# Patient Record
Sex: Male | Born: 1960 | Race: White | Hispanic: No | Marital: Single | Smoking: Current every day smoker
Health system: Southern US, Community
[De-identification: ages and names within clinical notes are randomized; demographics above are authoritative.]

## PROBLEM LIST (undated history)

## (undated) DIAGNOSIS — E119 Type 2 diabetes mellitus without complications: Secondary | ICD-10-CM

## (undated) DIAGNOSIS — IMO0001 Reserved for inherently not codable concepts without codable children: Secondary | ICD-10-CM

## (undated) DIAGNOSIS — I10 Essential (primary) hypertension: Secondary | ICD-10-CM

## (undated) HISTORY — PX: OTHER SURGICAL HISTORY: SHX169

## (undated) HISTORY — PX: FOOT SURGERY: SHX648

---

## 2000-03-18 ENCOUNTER — Encounter: Admission: RE | Admit: 2000-03-18 | Discharge: 2000-06-16 | Payer: Self-pay | Admitting: Family Medicine

## 2000-08-02 ENCOUNTER — Encounter: Admission: RE | Admit: 2000-08-02 | Discharge: 2000-10-31 | Payer: Self-pay | Admitting: Family Medicine

## 2006-05-24 ENCOUNTER — Emergency Department (HOSPITAL_COMMUNITY): Admission: EM | Admit: 2006-05-24 | Discharge: 2006-05-25 | Payer: Self-pay | Admitting: Emergency Medicine

## 2008-06-10 ENCOUNTER — Emergency Department (HOSPITAL_COMMUNITY): Admission: EM | Admit: 2008-06-10 | Discharge: 2008-06-10 | Payer: Self-pay | Admitting: Emergency Medicine

## 2014-10-02 ENCOUNTER — Observation Stay (HOSPITAL_BASED_OUTPATIENT_CLINIC_OR_DEPARTMENT_OTHER)
Admission: EM | Admit: 2014-10-02 | Discharge: 2014-10-03 | Disposition: A | Discharge status: Home / Self Care | Payer: 59 | Attending: Internal Medicine | Admitting: Internal Medicine

## 2014-10-02 ENCOUNTER — Emergency Department (HOSPITAL_BASED_OUTPATIENT_CLINIC_OR_DEPARTMENT_OTHER): Payer: 59

## 2014-10-02 ENCOUNTER — Encounter (HOSPITAL_BASED_OUTPATIENT_CLINIC_OR_DEPARTMENT_OTHER): Payer: Self-pay

## 2014-10-02 DIAGNOSIS — E876 Hypokalemia: Secondary | ICD-10-CM | POA: Insufficient documentation

## 2014-10-02 DIAGNOSIS — E119 Type 2 diabetes mellitus without complications: Secondary | ICD-10-CM

## 2014-10-02 DIAGNOSIS — R079 Chest pain, unspecified: Secondary | ICD-10-CM | POA: Diagnosis present

## 2014-10-02 DIAGNOSIS — Z7982 Long term (current) use of aspirin: Secondary | ICD-10-CM | POA: Insufficient documentation

## 2014-10-02 DIAGNOSIS — R0602 Shortness of breath: Secondary | ICD-10-CM | POA: Diagnosis not present

## 2014-10-02 DIAGNOSIS — I10 Essential (primary) hypertension: Secondary | ICD-10-CM | POA: Insufficient documentation

## 2014-10-02 DIAGNOSIS — R0789 Other chest pain: Secondary | ICD-10-CM | POA: Insufficient documentation

## 2014-10-02 DIAGNOSIS — Z720 Tobacco use: Secondary | ICD-10-CM | POA: Diagnosis present

## 2014-10-02 DIAGNOSIS — E785 Hyperlipidemia, unspecified: Secondary | ICD-10-CM | POA: Diagnosis not present

## 2014-10-02 HISTORY — DX: Essential (primary) hypertension: I10

## 2014-10-02 HISTORY — DX: Reserved for inherently not codable concepts without codable children: IMO0001

## 2014-10-02 HISTORY — DX: Type 2 diabetes mellitus without complications: E11.9

## 2014-10-02 LAB — CBC WITH DIFFERENTIAL/PLATELET
Basophils Absolute: 0.1 10*3/uL (ref 0.0–0.1)
Basophils Relative: 1 % (ref 0–1)
Eosinophils Absolute: 0.1 10*3/uL (ref 0.0–0.7)
Eosinophils Relative: 1 % (ref 0–5)
HCT: 49.1 % (ref 39.0–52.0)
Hemoglobin: 17.7 g/dL — ABNORMAL HIGH (ref 13.0–17.0)
Lymphocytes Relative: 15 % (ref 12–46)
Lymphs Abs: 1.2 10*3/uL (ref 0.7–4.0)
MCH: 33.2 pg (ref 26.0–34.0)
MCHC: 36 g/dL (ref 30.0–36.0)
MCV: 92.1 fL (ref 78.0–100.0)
Monocytes Absolute: 0.8 10*3/uL (ref 0.1–1.0)
Monocytes Relative: 10 % (ref 3–12)
NEUTROS ABS: 5.8 10*3/uL (ref 1.7–7.7)
NEUTROS PCT: 73 % (ref 43–77)
Platelets: 170 10*3/uL (ref 150–400)
RBC: 5.33 MIL/uL (ref 4.22–5.81)
RDW: 12.6 % (ref 11.5–15.5)
WBC: 7.9 10*3/uL (ref 4.0–10.5)

## 2014-10-02 LAB — BASIC METABOLIC PANEL
Anion gap: 8 (ref 5–15)
BUN: 15 mg/dL (ref 6–23)
CALCIUM: 9.1 mg/dL (ref 8.4–10.5)
CO2: 27 mmol/L (ref 19–32)
Chloride: 106 mmol/L (ref 96–112)
Creatinine, Ser: 1 mg/dL (ref 0.50–1.35)
GFR calc Af Amer: >90 mL/min (ref >90)
GFR calc non Af Amer: 84 mL/min — ABNORMAL LOW (ref >90)
GLUCOSE: 167 mg/dL — AB (ref 70–99)
Potassium: 3.5 mmol/L (ref 3.5–5.1)
SODIUM: 141 mmol/L (ref 135–145)

## 2014-10-02 LAB — TROPONIN I: Troponin I: <0.03 ng/mL (ref <0.031)

## 2014-10-02 MED ORDER — NITROGLYCERIN 0.4 MG SL SUBL
0.4000 mg | SUBLINGUAL_TABLET | SUBLINGUAL | Status: DC | PRN
  Administered 2014-10-02: 0.4 mg via SUBLINGUAL
  Filled 2014-10-02: qty 1

## 2014-10-02 MED ORDER — ASPIRIN 81 MG PO CHEW
324.0000 mg | CHEWABLE_TABLET | Freq: Once | ORAL | Status: AC
  Administered 2014-10-02: 324 mg via ORAL
  Filled 2014-10-02: qty 4

## 2014-10-02 NOTE — ED Notes (Signed)
C/o SOB, sweats last week with exertion-chest "heaviness" x today

## 2014-10-02 NOTE — ED Provider Notes (Signed)
CSN: [id number]     Arrival date & time 10/02/14  1853 History  This chart was scribed for Rolan BuccoMelanie Perrin Eddleman, MD by Modena JanskyAlbert Thayil, ED Scribe. This patient was seen in room MH05/MH05 and the patient's care was started at 9:37 PM.    Chief Complaint  Patient presents with  . Shortness of Breath   The history is provided by the patient. No language interpreter was used.   HPI Comments: Bosie HelperRichard Gangemi is a 54 y.o. male who presents to the Emergency Department complaining of constant moderate chest pain that started this morning. He states that this morning he had a sudden onset of chest tightness and pressure with SOB. He reports that the SOB lasted about 5 minutes. He reports that he had another episode of SOB with diaphoresis a few days ago. He states no modifying factors. He reports a hx of DM, HTN, hypercholesterolemia, and smoking. He denies any usual leg swelling, leg pain, diaphoresis, nausea, or cold symptoms today.   Past Medical History  Diagnosis Date  . Diabetes mellitus without complication   . Hypertension    Past Surgical History  Procedure Laterality Date  . Foot surgery    . Ankle surgery     No family history on file. History  Substance Use Topics  . Smoking status: Current Every Day Smoker  . Smokeless tobacco: Not on file  . Alcohol Use: Yes    Review of Systems  Constitutional: Negative for fever, chills, diaphoresis and fatigue.  HENT: Negative for congestion, rhinorrhea and sneezing.   Eyes: Negative.   Respiratory: Positive for chest tightness and shortness of breath. Negative for cough.   Cardiovascular: Positive for chest pain. Negative for leg swelling.  Gastrointestinal: Negative for nausea, vomiting, abdominal pain, diarrhea and blood in stool.  Genitourinary: Negative for frequency, hematuria, flank pain and difficulty urinating.  Musculoskeletal: Negative for back pain and arthralgias.  Skin: Negative for rash.  Neurological: Negative for dizziness,  speech difficulty, weakness, numbness and headaches.   Allergies  Review of patient's allergies indicates no known allergies.  Home Medications   Prior to Admission medications   Medication Sig Start Date End Date Taking? Authorizing Provider  AMLODIPINE BESYLATE PO Take by mouth.   Yes Historical Provider, MD  CARVEDILOL PO Take by mouth.   Yes Historical Provider, MD  HYDRALAZINE-HCTZ PO Take by mouth.   Yes Historical Provider, MD  LISINOPRIL PO Take by mouth.   Yes Historical Provider, MD  METFORMIN HCL PO Take by mouth.   Yes Historical Provider, MD  SIMVASTATIN PO Take by mouth.   Yes Historical Provider, MD   BP 162/74 mmHg  Pulse 85  Temp(Src) 98.5 F (36.9 C) (Oral)  Resp 18  Ht 6\' 3"  (1.905 m)  Wt 267 lb (121.11 kg)  BMI 33.37 kg/m2  SpO2 99% Physical Exam  Constitutional: He is oriented to person, place, and time. He appears well-developed and well-nourished.  HENT:  Head: Normocephalic and atraumatic.  Eyes: Pupils are equal, round, and reactive to light.  Neck: Normal range of motion. Neck supple.  Cardiovascular: Normal rate, regular rhythm and normal heart sounds.   Pulmonary/Chest: Effort normal and breath sounds normal. No respiratory distress. He has no wheezes. He has no rales. He exhibits no tenderness.  Abdominal: Soft. Bowel sounds are normal. There is no tenderness. There is no rebound and no guarding.  Musculoskeletal: Normal range of motion.  Trace pitting edema bilaterally. No calf tenderness.   Lymphadenopathy:    He  has no cervical adenopathy.  Neurological: He is alert and oriented to person, place, and time.  Skin: Skin is warm and dry. No rash noted.  Psychiatric: He has a normal mood and affect.  Nursing note and vitals reviewed.   ED Course  Procedures (including critical care time) DIAGNOSTIC STUDIES: Oxygen Saturation is 99% on RA, normal by my interpretation.    COORDINATION OF CARE: 9:41 PM- Pt advised of plan for treatment which  includes radiology and labs and pt agrees.  Labs Review Labs Reviewed  CBC WITH DIFFERENTIAL/PLATELET - Abnormal; Notable for the following:    Hemoglobin 17.7 (*)    All other components within normal limits  BASIC METABOLIC PANEL - Abnormal; Notable for the following:    Glucose, Bld 167 (*)    GFR calc non Af Amer 84 (*)    All other components within normal limits  TROPONIN I    Imaging Review Dg Chest 2 View  10/02/2014   CLINICAL DATA:  Shortness of breath chest numbness and heaviness today  EXAM: CHEST  2 VIEW  COMPARISON:  None.  FINDINGS: The heart size and mediastinal contours are within normal limits. Both lungs are clear. The visualized skeletal structures are unremarkable.  IMPRESSION: No active cardiopulmonary disease.   Electronically Signed   By: Esperanza Heir M.D.   On: 10/02/2014 19:59     EKG Interpretation   Date/Time:  Tuesday October 02 2014 19:09:16 EDT Ventricular Rate:  81 PR Interval:  198 QRS Duration: 102 QT Interval:  372 QTC Calculation: 432 R Axis:   -56 Text Interpretation:  Normal sinus rhythm Incomplete right bundle branch  block Left anterior fascicular block Minimal voltage criteria for LVH, may  be normal variant Possible Lateral infarct , age undetermined Abnormal ECG  No old tracing to compare Confirmed by Carlen Fils  MD, Cheyla Duchemin ([id number]) on  10/02/2014 7:12:54 PM      MDM   Final diagnoses:  Chest pain, unspecified chest pain type    Patient presents with chest pain associated shortness of breath. It was exertional this weekend but nonexertional today. He has multiple cardiac risk factors. He was given aspirin and nitroglycerin. He is currently pain-free. His EKG does not show any acute ischemia changes. His troponin is negative. Given his underlying risk factors I consult with the hospitalist, Dr.Niu who is accepted the patient for transfer to [facility] for further cardiac evaluation. I personally performed the services described in this  documentation, which was scribed in my presence.  The recorded information has been reviewed and considered.     Rolan Bucco, MD 10/02/14 [phone]

## 2014-10-03 ENCOUNTER — Encounter (HOSPITAL_COMMUNITY): Payer: Self-pay | Admitting: *Deleted

## 2014-10-03 DIAGNOSIS — E119 Type 2 diabetes mellitus without complications: Secondary | ICD-10-CM | POA: Diagnosis not present

## 2014-10-03 DIAGNOSIS — R079 Chest pain, unspecified: Secondary | ICD-10-CM | POA: Diagnosis not present

## 2014-10-03 DIAGNOSIS — I10 Essential (primary) hypertension: Secondary | ICD-10-CM | POA: Diagnosis not present

## 2014-10-03 DIAGNOSIS — Z720 Tobacco use: Secondary | ICD-10-CM

## 2014-10-03 DIAGNOSIS — R0602 Shortness of breath: Secondary | ICD-10-CM

## 2014-10-03 LAB — CBC
HCT: 46.5 % (ref 39.0–52.0)
Hemoglobin: 16.6 g/dL (ref 13.0–17.0)
MCH: 33.4 pg (ref 26.0–34.0)
MCHC: 35.7 g/dL (ref 30.0–36.0)
MCV: 93.6 fL (ref 78.0–100.0)
Platelets: 161 10*3/uL (ref 150–400)
RBC: 4.97 MIL/uL (ref 4.22–5.81)
RDW: 12.8 % (ref 11.5–15.5)
WBC: 7.4 10*3/uL (ref 4.0–10.5)

## 2014-10-03 LAB — LIPID PANEL
CHOLESTEROL: 106 mg/dL (ref 0–200)
HDL: 29 mg/dL — ABNORMAL LOW (ref >39)
LDL Cholesterol: 52 mg/dL (ref 0–99)
Total CHOL/HDL Ratio: 3.7 RATIO
Triglycerides: 127 mg/dL (ref <150)
VLDL: 25 mg/dL (ref 0–40)

## 2014-10-03 LAB — GLUCOSE, CAPILLARY
GLUCOSE-CAPILLARY: 142 mg/dL — AB (ref 70–99)
Glucose-Capillary: 129 mg/dL — ABNORMAL HIGH (ref 70–99)
Glucose-Capillary: 139 mg/dL — ABNORMAL HIGH (ref 70–99)

## 2014-10-03 LAB — BASIC METABOLIC PANEL
Anion gap: 9 (ref 5–15)
BUN: 10 mg/dL (ref 6–23)
CHLORIDE: 104 mmol/L (ref 96–112)
CO2: 27 mmol/L (ref 19–32)
CREATININE: 0.89 mg/dL (ref 0.50–1.35)
Calcium: 8.9 mg/dL (ref 8.4–10.5)
GFR calc non Af Amer: >90 mL/min (ref >90)
Glucose, Bld: 111 mg/dL — ABNORMAL HIGH (ref 70–99)
POTASSIUM: 3.4 mmol/L — AB (ref 3.5–5.1)
Sodium: 140 mmol/L (ref 135–145)

## 2014-10-03 LAB — TROPONIN I
Troponin I: <0.03 ng/mL (ref <0.031)
Troponin I: <0.03 ng/mL (ref <0.031)
Troponin I: <0.03 ng/mL (ref <0.031)

## 2014-10-03 MED ORDER — HYDROCHLOROTHIAZIDE 12.5 MG PO CAPS
12.5000 mg | ORAL_CAPSULE | Freq: Every day | ORAL | Status: DC
  Administered 2014-10-03: 12.5 mg via ORAL
  Filled 2014-10-03: qty 1

## 2014-10-03 MED ORDER — MORPHINE SULFATE 2 MG/ML IJ SOLN: 2.0000 mg | INTRAMUSCULAR | Status: DC | PRN

## 2014-10-03 MED ORDER — INSULIN ASPART 100 UNIT/ML ~~LOC~~ SOLN
0.0000 [IU] | Freq: Three times a day (TID) | SUBCUTANEOUS | Status: DC
  Administered 2014-10-03: 1 [IU] via SUBCUTANEOUS

## 2014-10-03 MED ORDER — AMLODIPINE BESYLATE 5 MG PO TABS
5.0000 mg | ORAL_TABLET | Freq: Every day | ORAL | Status: DC
  Administered 2014-10-03: 5 mg via ORAL
  Filled 2014-10-03: qty 1

## 2014-10-03 MED ORDER — ASPIRIN EC 325 MG PO TBEC
325.0000 mg | DELAYED_RELEASE_TABLET | Freq: Every day | ORAL | Status: DC
  Administered 2014-10-03: 325 mg via ORAL
  Filled 2014-10-03: qty 1

## 2014-10-03 MED ORDER — ROSUVASTATIN CALCIUM 20 MG PO TABS: 20.0000 mg | ORAL_TABLET | Freq: Every day | ORAL | Status: AC

## 2014-10-03 MED ORDER — CARVEDILOL 25 MG PO TABS: 25.0000 mg | ORAL_TABLET | Freq: Two times a day (BID) | ORAL | Status: AC

## 2014-10-03 MED ORDER — HYDRALAZINE HCL 10 MG PO TABS
10.0000 mg | ORAL_TABLET | Freq: Two times a day (BID) | ORAL | Status: DC
  Administered 2014-10-03: 10 mg via ORAL
  Filled 2014-10-03 (×2): qty 1

## 2014-10-03 MED ORDER — NITROGLYCERIN 0.4 MG SL SUBL: 0.4000 mg | SUBLINGUAL_TABLET | SUBLINGUAL | Status: AC | PRN

## 2014-10-03 MED ORDER — HYDROCHLOROTHIAZIDE 25 MG PO TABS: 25.0000 mg | ORAL_TABLET | Freq: Every day | ORAL | Status: DC

## 2014-10-03 MED ORDER — LISINOPRIL 10 MG PO TABS: 10.0000 mg | ORAL_TABLET | Freq: Every day | ORAL | Status: AC

## 2014-10-03 MED ORDER — GI COCKTAIL ~~LOC~~
30.0000 mL | Freq: Four times a day (QID) | ORAL | Status: DC | PRN
  Filled 2014-10-03: qty 30

## 2014-10-03 MED ORDER — ONDANSETRON HCL 4 MG/2ML IJ SOLN: 4.0000 mg | Freq: Four times a day (QID) | INTRAMUSCULAR | Status: DC | PRN

## 2014-10-03 MED ORDER — ACETAMINOPHEN 325 MG PO TABS: 650.0000 mg | ORAL_TABLET | ORAL | Status: DC | PRN

## 2014-10-03 MED ORDER — LISINOPRIL 10 MG PO TABS
10.0000 mg | ORAL_TABLET | Freq: Every day | ORAL | Status: DC
  Administered 2014-10-03: 10 mg via ORAL
  Filled 2014-10-03: qty 1

## 2014-10-03 MED ORDER — CARVEDILOL 25 MG PO TABS
25.0000 mg | ORAL_TABLET | Freq: Two times a day (BID) | ORAL | Status: DC
  Filled 2014-10-03: qty 1

## 2014-10-03 MED ORDER — CARVEDILOL 6.25 MG PO TABS
6.2500 mg | ORAL_TABLET | Freq: Two times a day (BID) | ORAL | Status: DC
  Administered 2014-10-03 (×2): 6.25 mg via ORAL
  Filled 2014-10-03 (×3): qty 1

## 2014-10-03 NOTE — Progress Notes (Signed)
Patient alert oriented x4, denies pain or shortness breath, SR on the monitor, BP elevated, BP medications given per MD order. Patient also ambulate in hall way and no c/o pain. Will continue to monitor patient.

## 2014-10-03 NOTE — H&P (Signed)
PCP:   No primary care provider on file.   Chief Complaint:  Cp, sob  HPI: 54 yo male with several episodes of sscp today with associated sob and palpitations that was exertional, happened twice today lasting each about 5 minutes.  Also happened several days ago for about 5 minutes, resolved with rest.  Today the second time it occurred, he got really nervous and finally came to the ED.   He was given aspirn and ntg which totally relieved his pain.  He has GERD but this felt different than his gerd.  No recent fevers, cough or le edema or swelling.  Some nausea but no vomiting.  No recent illnesses.  No cp at this time.  Had stress testing done 20 years ago.  No cardiac issues that he is aware of.  No leg /calf pain.    Review of Systems:  Positive and negative as per HPI otherwise all other systems are negative  Past Medical History: Past Medical History  Diagnosis Date  . Diabetes mellitus without complication   . Hypertension    Past Surgical History  Procedure Laterality Date  . Foot surgery    . Ankle surgery      Medications: Prior to Admission medications   Medication Sig Start Date End Date Taking? Authorizing Provider  AMLODIPINE BESYLATE PO Take by mouth.   Yes Historical Provider, MD  CARVEDILOL PO Take by mouth.   Yes Historical Provider, MD  HYDRALAZINE-HCTZ PO Take by mouth.   Yes Historical Provider, MD  LISINOPRIL PO Take by mouth.   Yes Historical Provider, MD  METFORMIN HCL PO Take by mouth.   Yes Historical Provider, MD  SIMVASTATIN PO Take by mouth.   Yes Historical Provider, MD    Allergies:  No Known Allergies  Social History:  reports that he has been smoking.  He does not have any smokeless tobacco history on file. He reports that he drinks alcohol. He reports that he does not use illicit drugs.  Family History: htn  Physical Exam: Filed Vitals:   10/02/14 2239 10/02/14 2330 10/03/14 0048 10/03/14 0052  BP: 155/82 155/82  145/76  Pulse: 82 70   74  Temp:   97.7 F (36.5 C)   TempSrc:   Oral   Resp: Height:      Weight:      SpO2:  96%  99%   General appearance: alert, cooperative and no distress Head: Normocephalic, without obvious abnormality, atraumatic Eyes: negative Throat: normal findings: lips normal without lesions Neck: no JVD and supple, symmetrical, trachea midline Lungs: clear to auscultation bilaterally Heart: regular rate and rhythm, S1, S2 normal, no murmur, click, rub or gallop Abdomen: soft, non-tender; bowel sounds normal; no masses,  no organomegaly Extremities: extremities normal, atraumatic, no cyanosis or edema Pulses: 2+ and symmetric Skin: Skin color, texture, turgor normal. No rashes or lesions Neurologic: Grossly normal    Labs on Admission:   Recent Labs  10/02/14 1935  NA 141  K 3.5  CL 106  CO2 27  GLUCOSE 167*  BUN 15  CREATININE 1.00  CALCIUM 9.1    Recent Labs  10/02/14 1935  WBC 7.9  NEUTROABS 5.8  HGB 17.7*  HCT 49.1  MCV 92.1  PLT 170    Recent Labs  10/02/14 1935  TROPONINI <0.03   Radiological Exams on Admission: Dg Chest 2 View  10/02/2014   CLINICAL DATA:  Shortness of breath chest numbness and heaviness today  EXAM: CHEST  2 VIEW  COMPARISON:  None.  FINDINGS: The heart size and mediastinal contours are within normal limits. Both lungs are clear. The visualized skeletal structures are unremarkable.  IMPRESSION: No active cardiopulmonary disease.   Electronically Signed   By: Esperanza Heiraymond  Rubner M.D.   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  romi with serial trop markers,  Echo in am.  Will need outpatient stress testing at some point.  Daily aspirin.  Active Problems:  Stable unless o/w noted   SOB (shortness of breath)   Diabetes mellitus without complication-   Tobacco abuse   Essential hypertension  obs  on tele.  Full code.  Home med rec needs reconciliation.  DAVID,RACHAL A 10/03/2014, 1:19 AM

## 2014-10-03 NOTE — Discharge Summary (Addendum)
Physician Discharge Summary  Reginald Medina ZOX:[id number] DOB: 08/14/1960 DOA: 10/02/2014  PCP: No primary care provider on file.  Admit date: 10/02/2014 Discharge date: 10/03/2014  Recommendations for Outpatient Follow-up:  1.  follow-up with cardiology for outpatient stress test 2.  recommend referral for pulmonary function tests and encouraged him to quit smoking.  Repeat BMP in 1 week to check potassium and creatinine after starting ACEI.  A1c pending at time of discharge.  Discharge Diagnoses:  Principal Problem:   Chest pain Active Problems:   SOB (shortness of breath)   Diabetes mellitus without complication   Tobacco abuse   Essential hypertension   Discharge Condition:  Stable, improved  Diet recommendation:  Diabetic, low sodium  Wt Readings from Last 3 Encounters:  10/03/14 121.4 kg (267 lb 10.2 oz)    History of present illness:   the patient is a 54 year old male with history of diabetes mellitus, hypertension who presented with several episodes of substernal chest pressure with associated shortness of breath and palpitations that was exertional. Each episode lasted for a proximally 5 minutes and resolved with rest. He was given aspirin and nitroglycerin which relieved his pain. He denied recent fevers, cough, lower extremity edema or swelling. He had some nausea without vomiting. His last stress test was approximately 20 years ago.   He states that he has been having progressive shortness of breath over the last few weeks  And cough productive of some thick sputum. He has felt wheezy and had sinus congestion.  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   His heart score was 6 , 1 for age, 2 for concerning story, 2 for multiple risk factors , and 1 for abnormal EKG.   His troponins were negative.   Echocardiogram demonstrated mild LVH with normal systolic function and  ejection fraction of 50-55%. There are no regional wall motion abnormalities. He had grade 1 diastolic dysfunction with moderately dilated left atrium and mildly dilated right atrium.   Hemoglobin A1c is pending and lipid panel demonstrated decreased HDL but LDL was less than 70. He continued aspirin and beta blocker and  Was started on high-dose statin with when necessary nitroglycerin to continue at home until he follows up with cardiology. He will be contacted by the cardiology office to schedule an appointment.   Ask for this primary care doctor consider referral for lung function tests.  Hypertension, blood pressures were somewhat elevated. He was started on lisinopril in addition to his Norvasc, carvedilol, hydrochlorothiazide both for BP control and because of diabetes.  Will need repeat BMP in 1 week.  Hyperlipidemia/dyslipidemia with story concerning for CAD. His simvastatin was discontinued and he was started on high-dose statin.  Diabetes mellitus type 2 ,  hemoglobin A1c pending. Continued diabetic diet at discharge.  Hypokalemia , likely secondary to hydrochlorothiazide. Given oral supplementation and started on ACE inhibitor.  Tobacco abuse, counseled him to stop smoking.  Procedures:   chest x-ray  Echocardiogram  Consultations:   none  Discharge Exam: Filed Vitals:   10/03/14 1540  BP: 142/81  Pulse: 69  Temp: 98.6 F (37 C)  Resp:    Filed Vitals:   10/03/14 0624 10/03/14 0747 10/03/14 0923 10/03/14 1540  BP: 166/79 159/87 165/89 142/81  Pulse: 58 60 66 69  Temp: 97.7 F (36.5 C)  98.6 F (37 C) 98.6 F (37 C)  TempSrc: Oral  Oral Oral  Resp: 18     Height:      Weight: 121.4 kg (267 lb 10.2 oz)     SpO2: 98%  96% 100%    General:  Adult male, no acute distress Cardiovascular:  Regular rate and rhythm, no murmurs rubs or gallops Respiratory:  Clear to auscultation bilaterally  Abdomen: NABS, soft, nondistended, nontender  MSK:  Normal tone and bulk,  no lower extremity edema  Discharge Instructions      Discharge Instructions    Call MD for:  difficulty breathing, headache or visual disturbances    Complete by:  As directed      Call MD for:  extreme fatigue    Complete by:  As directed      Call MD for:  hives    Complete by:  As directed      Call MD for:  persistant dizziness or light-headedness    Complete by:  As directed      Call MD for:  persistant nausea and vomiting    Complete by:  As directed      Call MD for:  severe uncontrolled pain    Complete by:  As directed      Call MD for:  temperature >100.4    Complete by:  As directed      Diet - low sodium heart healthy    Complete by:  As directed      Diet Carb Modified    Complete by:  As directed      Discharge instructions    Complete by:  As directed   You were hospitalized with shortness of breath and chest pain. Your symptoms may be due to heart disease. Please continue to take your blood pressure medications as prescribed. Make sure you were taking her carvedilol twice a day if it is normal generic medication. If you are taking an extended release version of carvedilol that lasts 24 hours, then this is a once a day medication.  Please look at your bottle when you get home.   I have given you a prescription for lisinopril , a blood pressure medication but also protects the kidneys. Please make sure you have blood work done in one week to check your potassium levels and your kidney function. This can be done by your primary care doctor.   Please stop your simvastatin and start taking Crestor. Finally, you may use nitroglycerin as needed for chest pain. If you have severe chest pain with shortness of breath , nausea , lightheadedness , please call 911 right away. Please make sure you take your aspirin every day. You will be called by the cardiology office to schedule an outpatient stress test. Please talk to your primary care doctor about getting scheduled for lung  function tests. Please quit smoking immediately.  Your ultrasound of your heart showed DIASTOLIC DYSFUNCTION or mild stiffness of the heart which can lead to heart failure.     Increase activity slowly    Complete by:  As directed             Medication List    STOP taking these medications        simvastatin 10 MG tablet  Commonly known as:  ZOCOR      TAKE these medications        amLODipine 5 MG tablet  Commonly known as:  NORVASC  Take 5 mg by mouth daily.     aspirin EC 81 MG tablet  Take 81 mg  by mouth daily.     carvedilol 25 MG tablet  Commonly known as:  COREG  Take 1 tablet (25 mg total) by mouth 2 (two) times daily with a meal.     hydrochlorothiazide 25 MG tablet  Commonly known as:  HYDRODIURIL  Take 25 mg by mouth daily.     lisinopril 10 MG tablet  Commonly known as:  PRINIVIL,ZESTRIL  Take 1 tablet (10 mg total) by mouth daily.     multivitamin tablet  Take 1 tablet by mouth daily.     nitroGLYCERIN 0.4 MG SL tablet  Commonly known as:  NITROSTAT  Place 1 tablet (0.4 mg total) under the tongue every 5 (five) minutes as needed for chest pain.     rosuvastatin 20 MG tablet  Commonly known as:  CRESTOR  Take 1 tablet (20 mg total) by mouth daily.       Follow-up Information    Follow up with [facility].   Specialty:  Cardiology   Why:  you will be called with appointment time and date   Contact information:   [address] [place] [address] [phone]      Follow up with Almedia Balls, MD. Schedule an appointment as soon as possible for a visit in 2 weeks.   Specialty:  Family Medicine   Contact information:   [address] [facility] [place] [address] [phone]        The results of significant diagnostics from this hospitalization (including imaging, microbiology, ancillary and laboratory) are listed below for reference.    Significant Diagnostic  Studies: Dg Chest 2 View  10/02/2014   CLINICAL DATA:  Shortness of breath chest numbness and heaviness today  EXAM: CHEST  2 VIEW  COMPARISON:  None.  FINDINGS: The heart size and mediastinal contours are within normal limits. Both lungs are clear. The visualized skeletal structures are unremarkable.  IMPRESSION: No active cardiopulmonary disease.   Electronically Signed   By: Esperanza Heir M.D.   On: 10/02/2014 19:59    Microbiology: No results found for this or any previous visit (from the past 240 hour(s)).   Labs: Basic Metabolic Panel:  Recent Labs Lab 10/02/14 1935 10/03/14 0802  NA 141 140  K 3.5 3.4*  CL 106 104  CO2 27 27  GLUCOSE 167* 111*  BUN 15 10  CREATININE 1.00 0.89  CALCIUM 9.1 8.9   Liver Function Tests: No results for input(s): AST, ALT, ALKPHOS, BILITOT, PROT, ALBUMIN in the last 168 hours. No results for input(s): LIPASE, AMYLASE in the last 168 hours. No results for input(s): AMMONIA in the last 168 hours. CBC:  Recent Labs Lab 10/02/14 1935 10/03/14 0802  WBC 7.9 7.4  NEUTROABS 5.8  --   HGB 17.7* 16.6  HCT 49.1 46.5  MCV 92.1 93.6  PLT 170 161   Cardiac Enzymes:  Recent Labs Lab 10/02/14 1935 10/03/14 0135 10/03/14 0420 10/03/14 0802  TROPONINI <0.03 <0.03 <0.03 <0.03   BNP: BNP (last 3 results) No results for input(s): BNP in the last 8760 hours.  ProBNP (last 3 results) No results for input(s): PROBNP in the last 8760 hours.  CBG:  Recent Labs Lab 10/03/14 0045 10/03/14 1140 10/03/14 1630  GLUCAP 139* 142* 129*    Time coordinating discharge: 35 minutes  Signed:  Hondo Nanda  [facility] 10/03/2014, 6:26 PM

## 2014-10-03 NOTE — Progress Notes (Signed)
Patient alert oriented no c/o pain or shortness of breath, V/S stable.  Iv and tele d/c, d/c information given, patient verbalized understanding.  Inform patient somebody will call [facility]. Patient d/c per order.

## 2014-10-03 NOTE — Progress Notes (Signed)
Pt. Arrived to unit from [facility] via [facility] in alert and stable condition. Pt. Denies any pain at this time. Pt. Alert, oriented, and ambulatory. VSS. Telemetry monitor applied. CCMD notified. Pt. Oriented to the room and call light placed within reach. Pt. Now resting in bed. RN will continue to monitor pt. For changes in condition. Ferrel Simington, Cheryll DessertKaren Cherrell

## 2014-10-03 NOTE — Progress Notes (Signed)
UR completed 

## 2014-10-03 NOTE — Progress Notes (Signed)
  Echocardiogram 2D Echocardiogram has been performed.  Cathie BeamsGREGORY, Manuel Lawhead 10/03/2014, 4:17 PM

## 2014-10-04 LAB — HEMOGLOBIN A1C
Hgb A1c MFr Bld: 6.1 % — ABNORMAL HIGH (ref 4.8–5.6)
MEAN PLASMA GLUCOSE: 128 mg/dL

## 2014-10-12 ENCOUNTER — Emergency Department (HOSPITAL_COMMUNITY)
Admission: EM | Admit: 2014-10-12 | Discharge: 2014-10-12 | Disposition: A | Discharge status: Home / Self Care | Payer: 59 | Attending: Emergency Medicine | Admitting: Emergency Medicine

## 2014-10-12 ENCOUNTER — Encounter (HOSPITAL_COMMUNITY): Payer: Self-pay | Admitting: Emergency Medicine

## 2014-10-12 ENCOUNTER — Emergency Department (HOSPITAL_COMMUNITY): Payer: 59

## 2014-10-12 DIAGNOSIS — E669 Obesity, unspecified: Secondary | ICD-10-CM | POA: Insufficient documentation

## 2014-10-12 DIAGNOSIS — R0602 Shortness of breath: Secondary | ICD-10-CM

## 2014-10-12 DIAGNOSIS — I10 Essential (primary) hypertension: Secondary | ICD-10-CM | POA: Diagnosis not present

## 2014-10-12 DIAGNOSIS — E119 Type 2 diabetes mellitus without complications: Secondary | ICD-10-CM | POA: Diagnosis not present

## 2014-10-12 DIAGNOSIS — R0600 Dyspnea, unspecified: Secondary | ICD-10-CM | POA: Diagnosis not present

## 2014-10-12 DIAGNOSIS — Z7951 Long term (current) use of inhaled steroids: Secondary | ICD-10-CM | POA: Insufficient documentation

## 2014-10-12 DIAGNOSIS — Z7982 Long term (current) use of aspirin: Secondary | ICD-10-CM | POA: Diagnosis not present

## 2014-10-12 DIAGNOSIS — Z79899 Other long term (current) drug therapy: Secondary | ICD-10-CM | POA: Insufficient documentation

## 2014-10-12 DIAGNOSIS — Z720 Tobacco use: Secondary | ICD-10-CM | POA: Diagnosis not present

## 2014-10-12 DIAGNOSIS — R0981 Nasal congestion: Secondary | ICD-10-CM | POA: Diagnosis not present

## 2014-10-12 LAB — D-DIMER, QUANTITATIVE (NOT AT ARMC): D-Dimer, Quant: <0.27 ug/mL-FEU (ref 0.00–0.48)

## 2014-10-12 NOTE — ED Provider Notes (Signed)
CSN: [id number]     Arrival date & time 10/12/14  2005 History  This chart was scribed for non-physician practitioner, Earley Favor, FNP,working with Richardean Canal, MD, by Karle Plumber, ED Scribe. This patient was seen in room WTR7/WTR7 and the patient's care was started at 8:36 PM.  Chief Complaint  Patient presents with  . Asthma   Patient is a 54 y.o. male presenting with asthma. The history is provided by the patient and medical records. No language interpreter was used.  Asthma Associated symptoms include shortness of breath. Pertinent negatives include no chest pain.    HPI Comments:  Reginald Medina is a 54 y.o. obese male, brought in by EMS, with PMHx of DM, dyspnea and HTN who presents to the Emergency Department complaining of congestion and labored breathing that began earlier today. Pt states his difficulty breathing began acutely approximately two weeks ago when he was helping his sister pack away some rugs. He reports finishing a Z-Pak yesterday. Pt was seen in the ED ten days ago and admitted and discharged the next day (nine days ago). He was instructed to follow up with cardiology for an outpatient stress test and to speak with his PCP about pulmonary function testing. Pt states he was seen by pulmonologist yesterday and was prescribed an inhaler (Albuterol and Advair). He reports he has been using the inhalers (Albuterol was used three times today) with no significant relief of the symptoms. Denies any modifying factors of the symptoms. Denies CP. Denies any recent travel. Denies h/o DVT or PE. He endorses smoking until recently.   Past Medical History  Diagnosis Date  . Diabetes mellitus without complication   . Hypertension   . Shortness of breath dyspnea    Past Surgical History  Procedure Laterality Date  . Foot surgery    . Ankle surgery     No family history on file. History  Substance Use Topics  . Smoking status: Current Every Day Smoker  . Smokeless tobacco: Not  on file  . Alcohol Use: Yes    Review of Systems  HENT: Positive for congestion.   Respiratory: Positive for shortness of breath.   Cardiovascular: Negative for chest pain.  All other systems reviewed and are negative.   Allergies  Review of patient's allergies indicates no known allergies.  Home Medications   Prior to Admission medications   Medication Sig Start Date End Date Taking? Authorizing Provider  albuterol (PROVENTIL HFA;VENTOLIN HFA) 108 (90 BASE) MCG/ACT inhaler Inhale 1 puff into the lungs every 6 (six) hours as needed for wheezing or shortness of breath.   Yes Historical Provider, MD  amLODipine (NORVASC) 5 MG tablet Take 5 mg by mouth daily. 07/27/14  Yes Historical Provider, MD  aspirin EC 81 MG tablet Take 81 mg by mouth daily.   Yes Historical Provider, MD  carvedilol (COREG) 25 MG tablet Take 1 tablet (25 mg total) by mouth 2 (two) times daily with a meal. 10/03/14  Yes [name], MD  fluticasone-salmeterol (ADVAIR HFA) 115-21 MCG/ACT inhaler Inhale 2 puffs into the lungs 2 (two) times daily.   Yes Historical Provider, MD  hydrochlorothiazide (HYDRODIURIL) 25 MG tablet Take 25 mg by mouth daily. 07/27/14  Yes Historical Provider, MD  lisinopril (PRINIVIL,ZESTRIL) 10 MG tablet Take 1 tablet (10 mg total) by mouth daily. 10/03/14  Yes [name], MD  Multiple Vitamin (MULTIVITAMIN) tablet Take 1 tablet by mouth daily.   Yes Historical Provider, MD  nitroGLYCERIN (NITROSTAT) 0.4 MG SL tablet  Place 1 tablet (0.4 mg total) under the tongue every 5 (five) minutes as needed for chest pain. 10/03/14  Yes Renae FickleMackenzie Short, MD  rosuvastatin (CRESTOR) 20 MG tablet Take 1 tablet (20 mg total) by mouth daily. 10/03/14  Yes Renae FickleMackenzie Short, MD   Triage Vitals: BP 114/83 mmHg  Pulse 70  Temp(Src) 97.9 F (36.6 C) (Oral)  Resp 20  SpO2 100% Physical Exam  Constitutional: He is oriented to person, place, and time. He appears well-developed and well-nourished.  HENT:  Head:  Normocephalic and atraumatic.  Eyes: EOM are normal.  Neck: Normal range of motion.  Cardiovascular: Normal rate, regular rhythm and normal heart sounds.  Exam reveals no gallop and no friction rub.   No murmur heard. Pulmonary/Chest: Effort normal and breath sounds normal. No respiratory distress. He has no wheezes. He has no rales.  Musculoskeletal: Normal range of motion.  Neurological: He is alert and oriented to person, place, and time.  Skin: Skin is warm and dry.  Psychiatric: He has a normal mood and affect. His behavior is normal.  Nursing note and vitals reviewed.   ED Course  Procedures (including critical care time) DIAGNOSTIC STUDIES: Oxygen Saturation is 100% on RA, normal by my interpretation.   COORDINATION OF CARE: 8:47 PM- Will order D-dimer. Pt verbalizes understanding and agrees to plan.  Medications - No data to display  Labs Review Labs Reviewed  D-DIMER, QUANTITATIVE    Imaging Review Dg Chest 2 View  10/12/2014   CLINICAL DATA:  Short of breath for 3 hr.  Smoker for 30 years.  EXAM: CHEST  2 VIEW  COMPARISON:  10/08/2014  FINDINGS: Mild hyperinflation. Lower thoracic spondylosis. Midline trachea. Normal heart size and mediastinal contours. No pleural effusion or pneumothorax. Clear lungs.  IMPRESSION: Mild hyperinflation, without acute disease.   Electronically Signed   By: Jeronimo GreavesKyle  Talbot M.D.   On: 10/12/2014 21:37     EKG Interpretation None      MDM  Patient is not wheezing.  He is not tachycardic.  He has no history of asthma during his hospitalization on 419 he had a cardiac evaluation including an echo, which was all normal.  Troponins he did follow-up with pulmonary, who started him on Advair and albuterol.  Patient has been using albuterol rescue inhaler frequently.  On arrival into the emergency department.  He states he feels like it may happen again.  Not quite sure what it is but is speaking in full sentences without any distress.  He does have  an infection in his right foot.  He is a non-insulin-dependent  Diabetic.  His respiratory issues started in the middle of April, after helping his sister may use some rugs, denies any recent trips.  He says he frequently has swelling of his feet.  I will check a d-dimer.  For thoroughness sake due to the acute onset of his respiratory issues. Final diagnoses:  SOB (shortness of breath)  Dyspnea       I personally performed the services described in this documentation, which was scribed in my presence. The recorded information has been reviewed and is accurate.    Earley FavorGail Chrystian Cupples, NP 10/12/14 2315  Richardean Canalavid H Yao, MD 10/13/14 [id number]

## 2014-10-12 NOTE — ED Notes (Signed)
Pt reports diff breathing.  Has hx of asthma.  Reports chest congestion.  Has tried his inhaler without relief.  Pt is able to complete a sentence.  Pt in NAD.  A&Ox 4.

## 2014-10-12 NOTE — Discharge Instructions (Signed)
Today your chest x-ray is normal.  Her vital signs are normal.  You're not having any wheezing.  Her oxygen saturation is 100 percent you're able to carry on a conversation without being short of breath.  I would recommend continue using her Advair and her albuterol per your pulmonologist instructions follow-up for your pulmonary function test which is the next in your evaluation for dyspnea

## 2014-10-12 NOTE — ED Notes (Signed)
Pt from home via [facility] c/o asthma. He reports using his inhalers but still feels congested.  Pt alert, oriented, and speaking in full sentences. He reports he feels as if it could happen again at any moment.

## 2015-09-06 ENCOUNTER — Encounter (HOSPITAL_BASED_OUTPATIENT_CLINIC_OR_DEPARTMENT_OTHER): Payer: Self-pay | Admitting: *Deleted

## 2015-09-06 ENCOUNTER — Emergency Department (HOSPITAL_BASED_OUTPATIENT_CLINIC_OR_DEPARTMENT_OTHER): Payer: Commercial Managed Care - HMO

## 2015-09-06 ENCOUNTER — Emergency Department (HOSPITAL_BASED_OUTPATIENT_CLINIC_OR_DEPARTMENT_OTHER)
Admission: EM | Admit: 2015-09-06 | Discharge: 2015-09-06 | Disposition: A | Discharge status: Home / Self Care | Payer: Commercial Managed Care - HMO | Attending: Emergency Medicine | Admitting: Emergency Medicine

## 2015-09-06 DIAGNOSIS — Z9889 Other specified postprocedural states: Secondary | ICD-10-CM | POA: Insufficient documentation

## 2015-09-06 DIAGNOSIS — S99922A Unspecified injury of left foot, initial encounter: Secondary | ICD-10-CM | POA: Diagnosis present

## 2015-09-06 DIAGNOSIS — I10 Essential (primary) hypertension: Secondary | ICD-10-CM | POA: Insufficient documentation

## 2015-09-06 DIAGNOSIS — L97509 Non-pressure chronic ulcer of other part of unspecified foot with unspecified severity: Secondary | ICD-10-CM | POA: Insufficient documentation

## 2015-09-06 DIAGNOSIS — Z79899 Other long term (current) drug therapy: Secondary | ICD-10-CM | POA: Insufficient documentation

## 2015-09-06 DIAGNOSIS — Y9289 Other specified places as the place of occurrence of the external cause: Secondary | ICD-10-CM | POA: Diagnosis not present

## 2015-09-06 DIAGNOSIS — Y998 Other external cause status: Secondary | ICD-10-CM | POA: Insufficient documentation

## 2015-09-06 DIAGNOSIS — S91114A Laceration without foreign body of right lesser toe(s) without damage to nail, initial encounter: Secondary | ICD-10-CM | POA: Insufficient documentation

## 2015-09-06 DIAGNOSIS — Y9301 Activity, walking, marching and hiking: Secondary | ICD-10-CM | POA: Diagnosis not present

## 2015-09-06 DIAGNOSIS — F17200 Nicotine dependence, unspecified, uncomplicated: Secondary | ICD-10-CM | POA: Insufficient documentation

## 2015-09-06 DIAGNOSIS — Z7982 Long term (current) use of aspirin: Secondary | ICD-10-CM | POA: Diagnosis not present

## 2015-09-06 DIAGNOSIS — Z7951 Long term (current) use of inhaled steroids: Secondary | ICD-10-CM | POA: Insufficient documentation

## 2015-09-06 DIAGNOSIS — E11621 Type 2 diabetes mellitus with foot ulcer: Secondary | ICD-10-CM | POA: Insufficient documentation

## 2015-09-06 DIAGNOSIS — W2209XA Striking against other stationary object, initial encounter: Secondary | ICD-10-CM | POA: Insufficient documentation

## 2015-09-06 DIAGNOSIS — R52 Pain, unspecified: Secondary | ICD-10-CM

## 2015-09-06 MED ORDER — CIPROFLOXACIN HCL 500 MG PO TABS: 500.0000 mg | ORAL_TABLET | Freq: Two times a day (BID) | ORAL | Status: AC

## 2015-09-06 NOTE — ED Notes (Signed)
Pt is wearing foot boot on lt foot due to heel pain and is seeing a foot specialist for that issue.

## 2015-09-06 NOTE — ED Notes (Signed)
Patient states he kicked the bed post with his 2nd toe on yesterday at @1530 . Patient c/o intermittent bleeding to the toe. Dried blood noted to 2nd and 3rd digits. There is a horizontal cut to the posterior side of the toe.

## 2015-09-06 NOTE — ED Provider Notes (Addendum)
CSN: [id number]     Arrival date & time 09/06/15  1134 History   First MD Initiated Contact with Patient 09/06/15 1149     Chief Complaint  Patient presents with  . Foot Pain     (Consider location/radiation/quality/duration/timing/severity/associated sxs/prior Treatment) HPI Comments: Patient is a 55 year old male with history of diabetes with lower extreme any ulcers. He presents for evaluation of right third toe injury. He states that he was walking and struck the toe on a bedpost yesterday. This caused a laceration to the bottom aspect of the toe continues to bleed today.  Patient is a 55 y.o. male presenting with lower extremity pain. The history is provided by the patient.  Foot Pain This is a new problem. The current episode started yesterday. The problem occurs constantly. The problem has not changed since onset.Nothing aggravates the symptoms. Nothing relieves the symptoms. He has tried nothing for the symptoms. The treatment provided no relief.    Past Medical History  Diagnosis Date  . Diabetes mellitus without complication (HCC)   . Hypertension   . Shortness of breath dyspnea    Past Surgical History  Procedure Laterality Date  . Foot surgery    . Ankle surgery     History reviewed. No pertinent family history. Social History  Substance Use Topics  . Smoking status: Current Every Day Smoker  . Smokeless tobacco: None  . Alcohol Use: Yes    Review of Systems  All other systems reviewed and are negative.     Allergies  Review of patient's allergies indicates no known allergies.  Home Medications   Prior to Admission medications   Medication Sig Start Date End Date Taking? Authorizing Provider  albuterol (PROVENTIL HFA;VENTOLIN HFA) 108 (90 BASE) MCG/ACT inhaler Inhale 1 puff into the lungs every 6 (six) hours as needed for wheezing or shortness of breath.   Yes Historical Provider, MD  amLODipine (NORVASC) 5 MG tablet Take 5 mg by mouth daily. 07/27/14  Yes  Historical Provider, MD  aspirin EC 81 MG tablet Take 81 mg by mouth daily.   Yes Historical Provider, MD  carvedilol (COREG) 25 MG tablet Take 1 tablet (25 mg total) by mouth 2 (two) times daily with a meal. 10/03/14  Yes [name], MD  fluticasone-salmeterol (ADVAIR HFA) 115-21 MCG/ACT inhaler Inhale 2 puffs into the lungs 2 (two) times daily.   Yes Historical Provider, MD  hydrochlorothiazide (HYDRODIURIL) 25 MG tablet Take 25 mg by mouth daily. 07/27/14  Yes Historical Provider, MD  lisinopril (PRINIVIL,ZESTRIL) 10 MG tablet Take 1 tablet (10 mg total) by mouth daily. 10/03/14  Yes [name], MD  Multiple Vitamin (MULTIVITAMIN) tablet Take 1 tablet by mouth daily.   Yes Historical Provider, MD  nitroGLYCERIN (NITROSTAT) 0.4 MG SL tablet Place 1 tablet (0.4 mg total) under the tongue every 5 (five) minutes as needed for chest pain. 10/03/14  Yes [name], MD  rosuvastatin (CRESTOR) 20 MG tablet Take 1 tablet (20 mg total) by mouth daily. 10/03/14  Yes [name], MD   BP 151/93 mmHg  Pulse 72  Temp(Src) 98.1 F (36.7 C) (Oral)  Resp 20  Ht  (1.905 m)  Wt 271 lb (122.925 kg)  BMI 33.87 kg/m2  SpO2 100% Physical Exam  Constitutional: He is oriented to person, place, and time. He appears well-developed and well-nourished. No distress.  HENT:  Head: Normocephalic and atraumatic.  Neck: Normal range of motion. Neck supple.  Musculoskeletal:  There is a 1 cm laceration  underneath the right second toe the bleeding is controlled.  Neurological: He is alert and oriented to person, place, and time.  Skin: Skin is warm and dry. He is not diaphoretic.  Nursing note and vitals reviewed.   ED Course  Procedures (including critical care time) Labs Review Labs Reviewed - No data to display  Imaging Review No results found. I have personally reviewed and evaluated these images and lab results as part of my medical decision-making.   EKG Interpretation None       MDM   Final diagnoses:  None    X-rays are negative for fracture. This laceration is nearly 24 hours old and not a candidate for stitches. There is no evidence for fracture adjacent to the laceration. This will be treated with local wound care and when necessary return. He should have close follow-up with his podiatrist as he is a diabetic    Geoffery Lyonsouglas Daemon Dowty, MD 09/06/15 1248  Geoffery Lyonsouglas Olene Godfrey, MD 09/06/15 1459

## 2015-09-06 NOTE — ED Notes (Signed)
Per pt report hit his rt  toes on the bed post while walking, laceration present with bleeding. Has foot boot on rt foot.

## 2015-09-06 NOTE — ED Notes (Signed)
Patient transported to X-ray 

## 2015-09-06 NOTE — ED Notes (Signed)
MD at bedside. 

## 2015-09-06 NOTE — Discharge Instructions (Signed)
Cipro as prescribed.  Local wound care with bacitracin and dressing changes twice daily.  Follow-up with your podiatrist in the next few days to have the laceration rechecked.  Return to the emergency department if symptoms significantly worsen or change.   Laceration Care, Adult A laceration is a cut that goes through all of the layers of the skin and into the tissue that is right under the skin. Some lacerations heal on their own. Others need to be closed with stitches (sutures), staples, skin adhesive strips, or skin glue. Proper laceration care minimizes the risk of infection and helps the laceration to heal better. HOW TO CARE FOR YOUR LACERATION If sutures or staples were used:  Keep the wound clean and dry.  If you were given a bandage (dressing), you should change it at least one time per day or as told by your health care provider. You should also change it if it becomes wet or dirty.  Keep the wound completely dry for the first 24 hours or as told by your health care provider. After that time, you may shower or bathe. However, make sure that the wound is not soaked in water until after the sutures or staples have been removed.  Clean the wound one time each day or as told by your health care provider:  Wash the wound with soap and water.  Rinse the wound with water to remove all soap.  Pat the wound dry with a clean towel. Do not rub the wound.  After cleaning the wound, apply a thin layer of antibiotic ointmentas told by your health care provider. This will help to prevent infection and keep the dressing from sticking to the wound.  Have the sutures or staples removed as told by your health care provider. If skin adhesive strips were used:  Keep the wound clean and dry.  If you were given a bandage (dressing), you should change it at least one time per day or as told by your health care provider. You should also change it if it becomes dirty or wet.  Do not get the  skin adhesive strips wet. You may shower or bathe, but be careful to keep the wound dry.  If the wound gets wet, pat it dry with a clean towel. Do not rub the wound.  Skin adhesive strips fall off on their own. You may trim the strips as the wound heals. Do not remove skin adhesive strips that are still stuck to the wound. They will fall off in time. If skin glue was used:  Try to keep the wound dry, but you may briefly wet it in the shower or bath. Do not soak the wound in water, such as by swimming.  After you have showered or bathed, gently pat the wound dry with a clean towel. Do not rub the wound.  Do not do any activities that will make you sweat heavily until the skin glue has fallen off on its own.  Do not apply liquid, cream, or ointment medicine to the wound while the skin glue is in place. Using those may loosen the film before the wound has healed.  If you were given a bandage (dressing), you should change it at least one time per day or as told by your health care provider. You should also change it if it becomes dirty or wet.  If a dressing is placed over the wound, be careful not to apply tape directly over the skin glue. Doing that may  cause the glue to be pulled off before the wound has healed.  Do not pick at the glue. The skin glue usually remains in place for 5-10 days, then it falls off of the skin. General Instructions  Take over-the-counter and prescription medicines only as told by your health care provider.  If you were prescribed an antibiotic medicine or ointment, take or apply it as told by your doctor. Do not stop using it even if your condition improves.  To help prevent scarring, make sure to cover your wound with sunscreen whenever you are outside after stitches are removed, after adhesive strips are removed, or when glue remains in place and the wound is healed. Make sure to wear a sunscreen of at least 30 SPF.  Do not scratch or pick at the wound.  Keep  all follow-up visits as told by your health care provider. This is important.  Check your wound every day for signs of infection. Watch for:  Redness, swelling, or pain.  Fluid, blood, or pus.  Raise (elevate) the injured area above the level of your heart while you are sitting or lying down, if possible. SEEK MEDICAL CARE IF:  You received a tetanus shot and you have swelling, severe pain, redness, or bleeding at the injection site.  You have a fever.  A wound that was closed breaks open.  You notice a bad smell coming from your wound or your dressing.  You notice something coming out of the wound, such as wood or glass.  Your pain is not controlled with medicine.  You have increased redness, swelling, or pain at the site of your wound.  You have fluid, blood, or pus coming from your wound.  You notice a change in the color of your skin near your wound.  You need to change the dressing frequently due to fluid, blood, or pus draining from the wound.  You develop a new rash.  You develop numbness around the wound. SEEK IMMEDIATE MEDICAL CARE IF:  You develop severe swelling around the wound.  Your pain suddenly increases and is severe.  You develop painful lumps near the wound or on skin that is anywhere on your body.  You have a red streak going away from your wound.  The wound is on your hand or foot and you cannot properly move a finger or toe.  The wound is on your hand or foot and you notice that your fingers or toes look pale or bluish.   This information is not intended to replace advice given to you by your health care provider. Make sure you discuss any questions you have with your health care provider.   Document Released: 06/01/2005 Document Revised: 10/16/2014 Document Reviewed: 05/28/2014 Elsevier Interactive Patient Education [employer]

## 2017-03-09 IMAGING — CR DG CHEST 2V
2 series · 2 of 2 positions shown · non-contrast
Comparison: None.

CLINICAL DATA: Shortness of breath chest numbness and heaviness
today

EXAM:
CHEST  2 VIEW

[w chest pa]
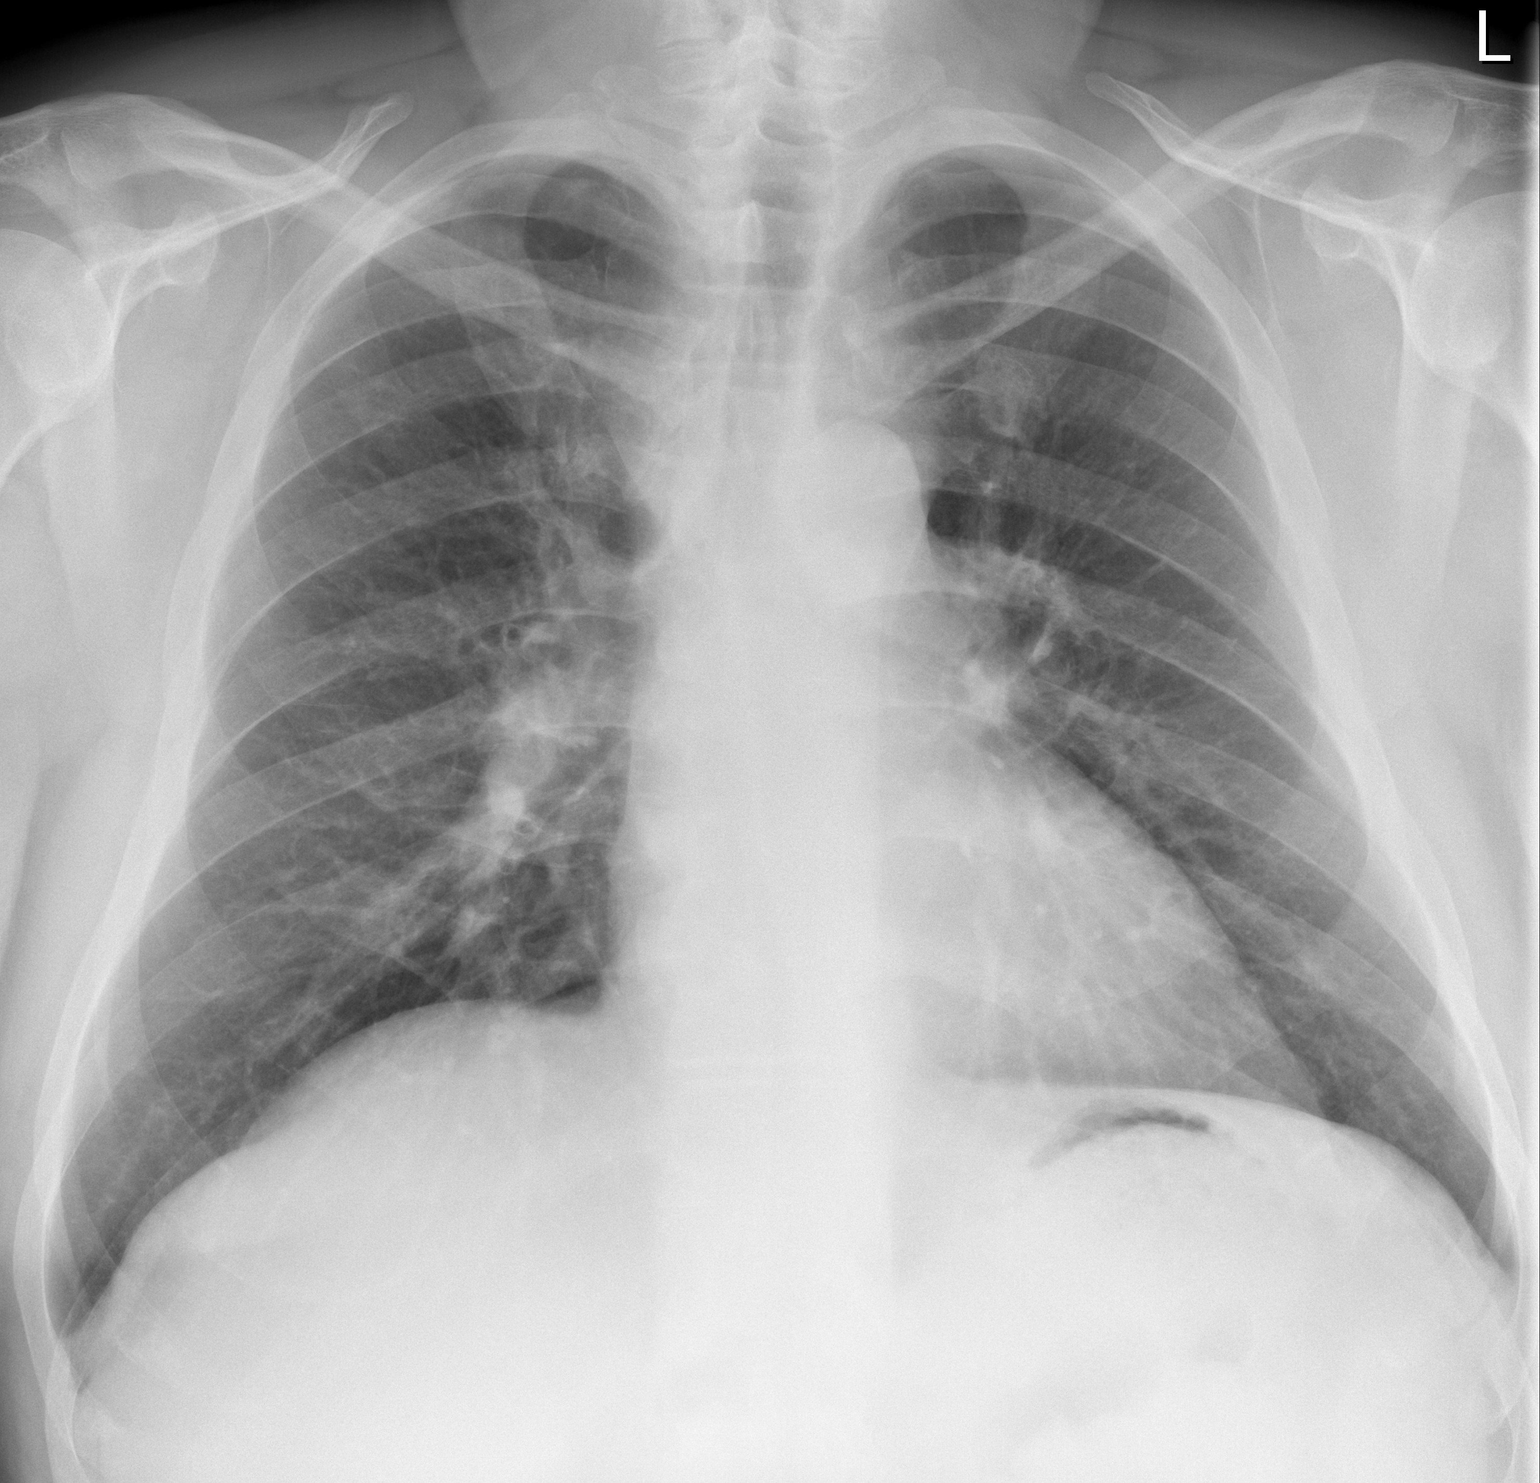

[w chest lat *]
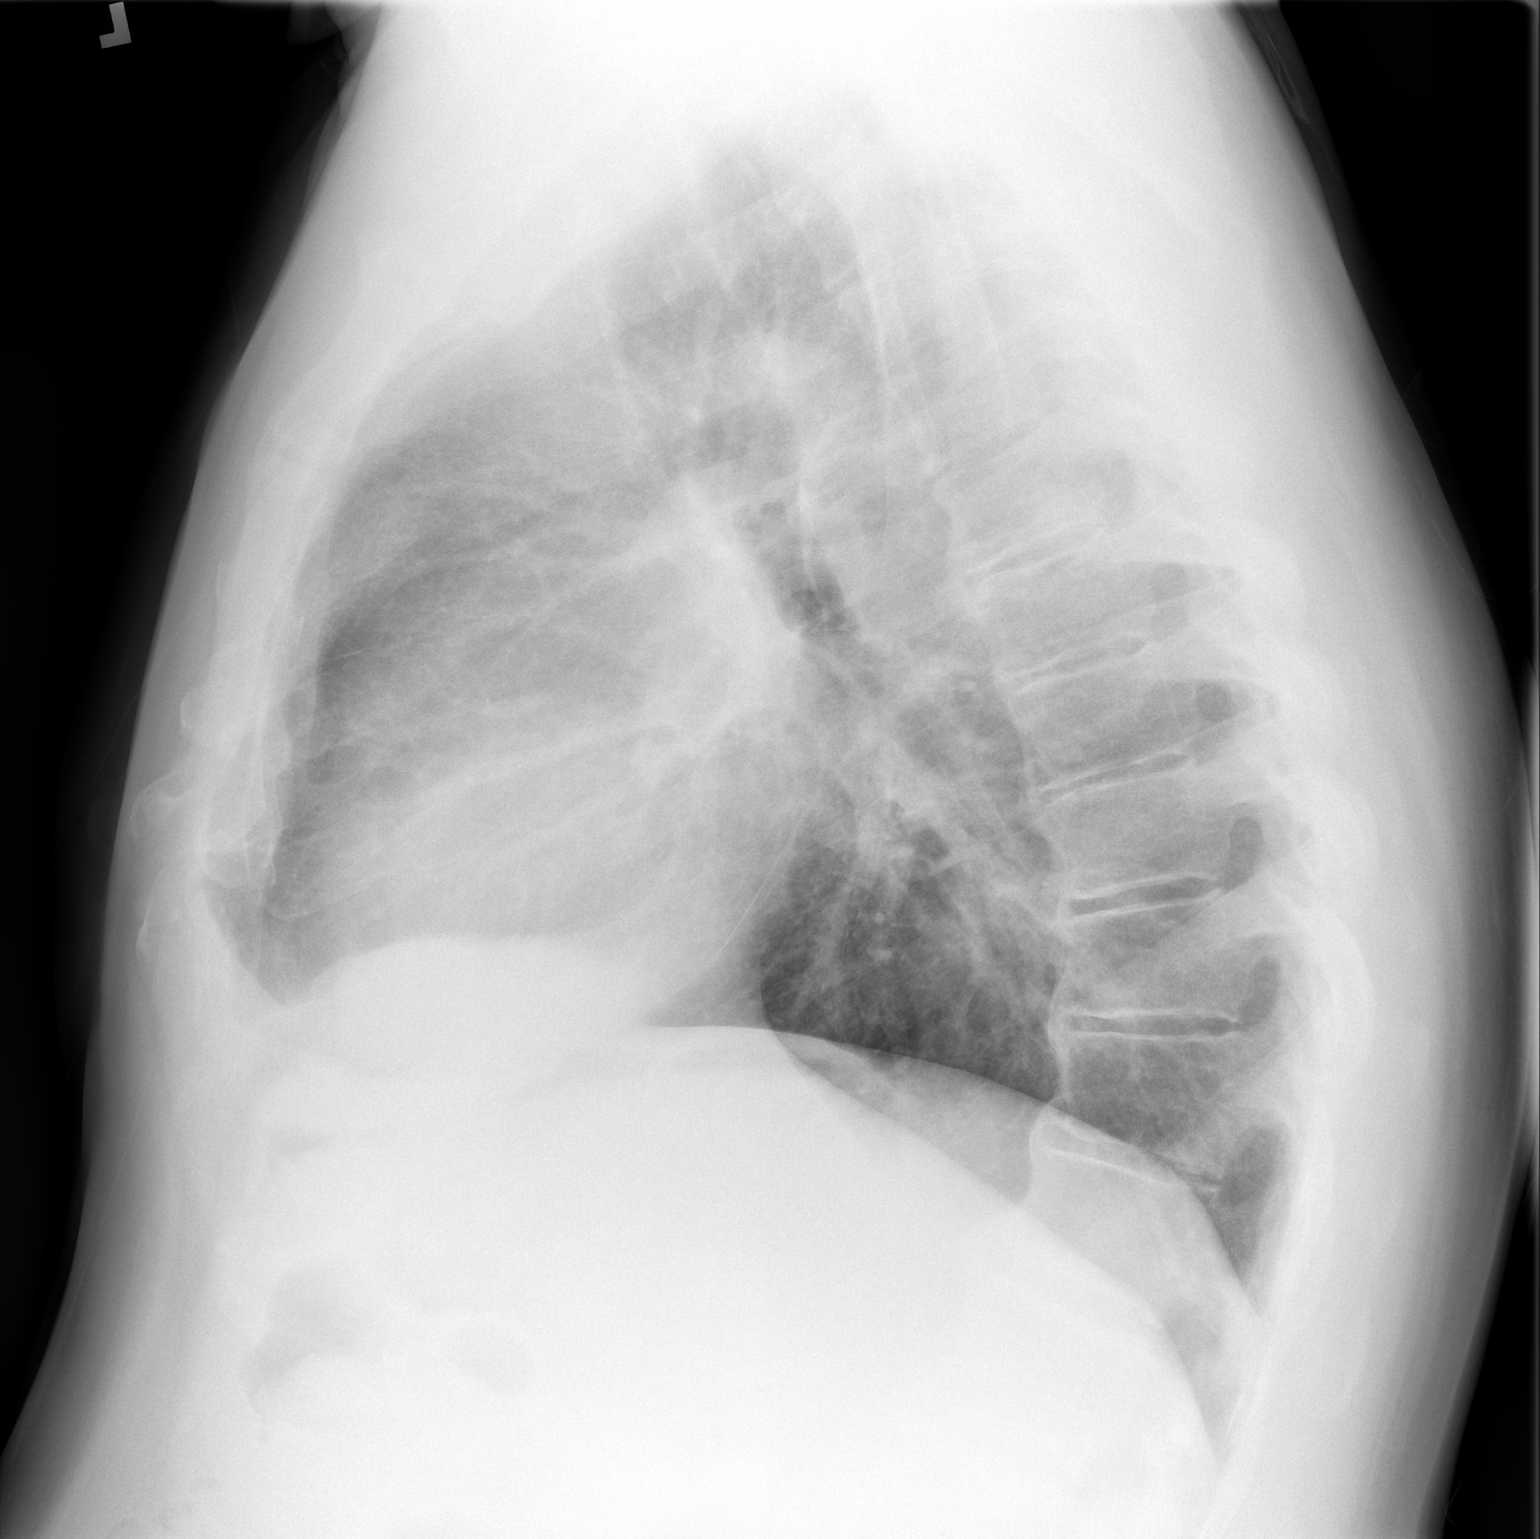

[2 of 2 positions shown; findings below may reference images not displayed]

FINDINGS: The heart size and mediastinal contours are within normal limits.
Both lungs are clear. The visualized skeletal structures are
unremarkable.
IMPRESSION: No active cardiopulmonary disease.

## 2018-02-11 IMAGING — CR DG TOE 2ND 2+V*R*
3 series · 3 of 3 positions shown · non-contrast
Comparison: None.

CLINICAL DATA: Blunt trauma to second toe on the bed, initial
encounter

EXAM:
RIGHT SECOND TOE

[t toes ap right]
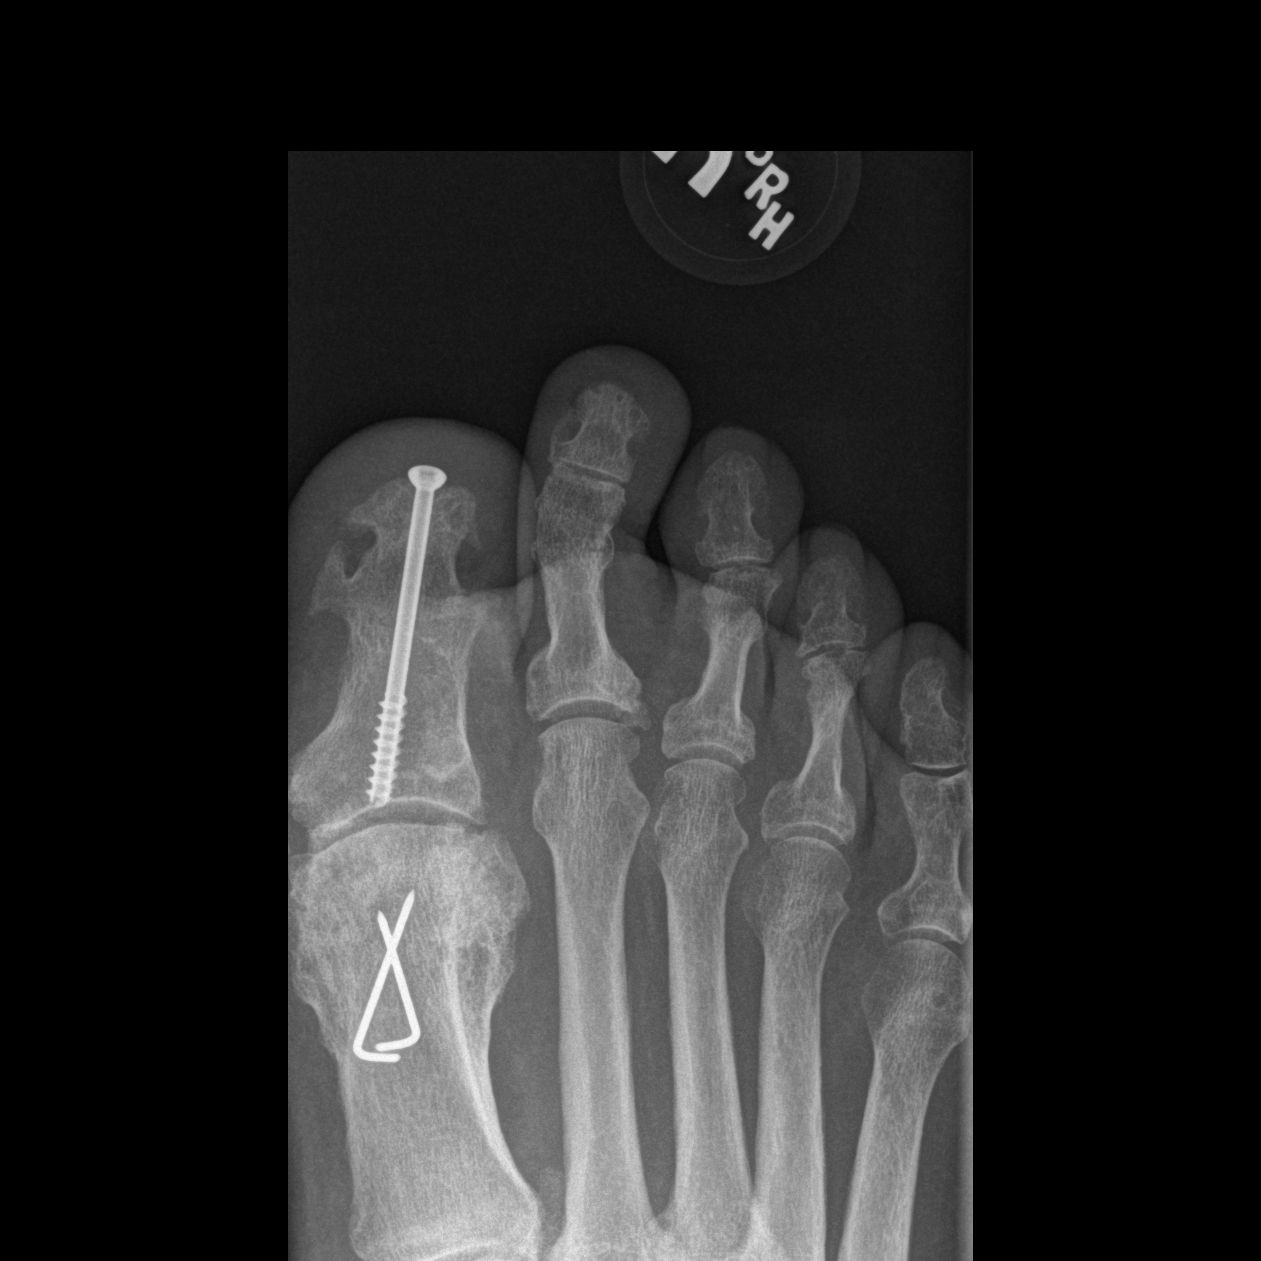

[t toes oblique right]
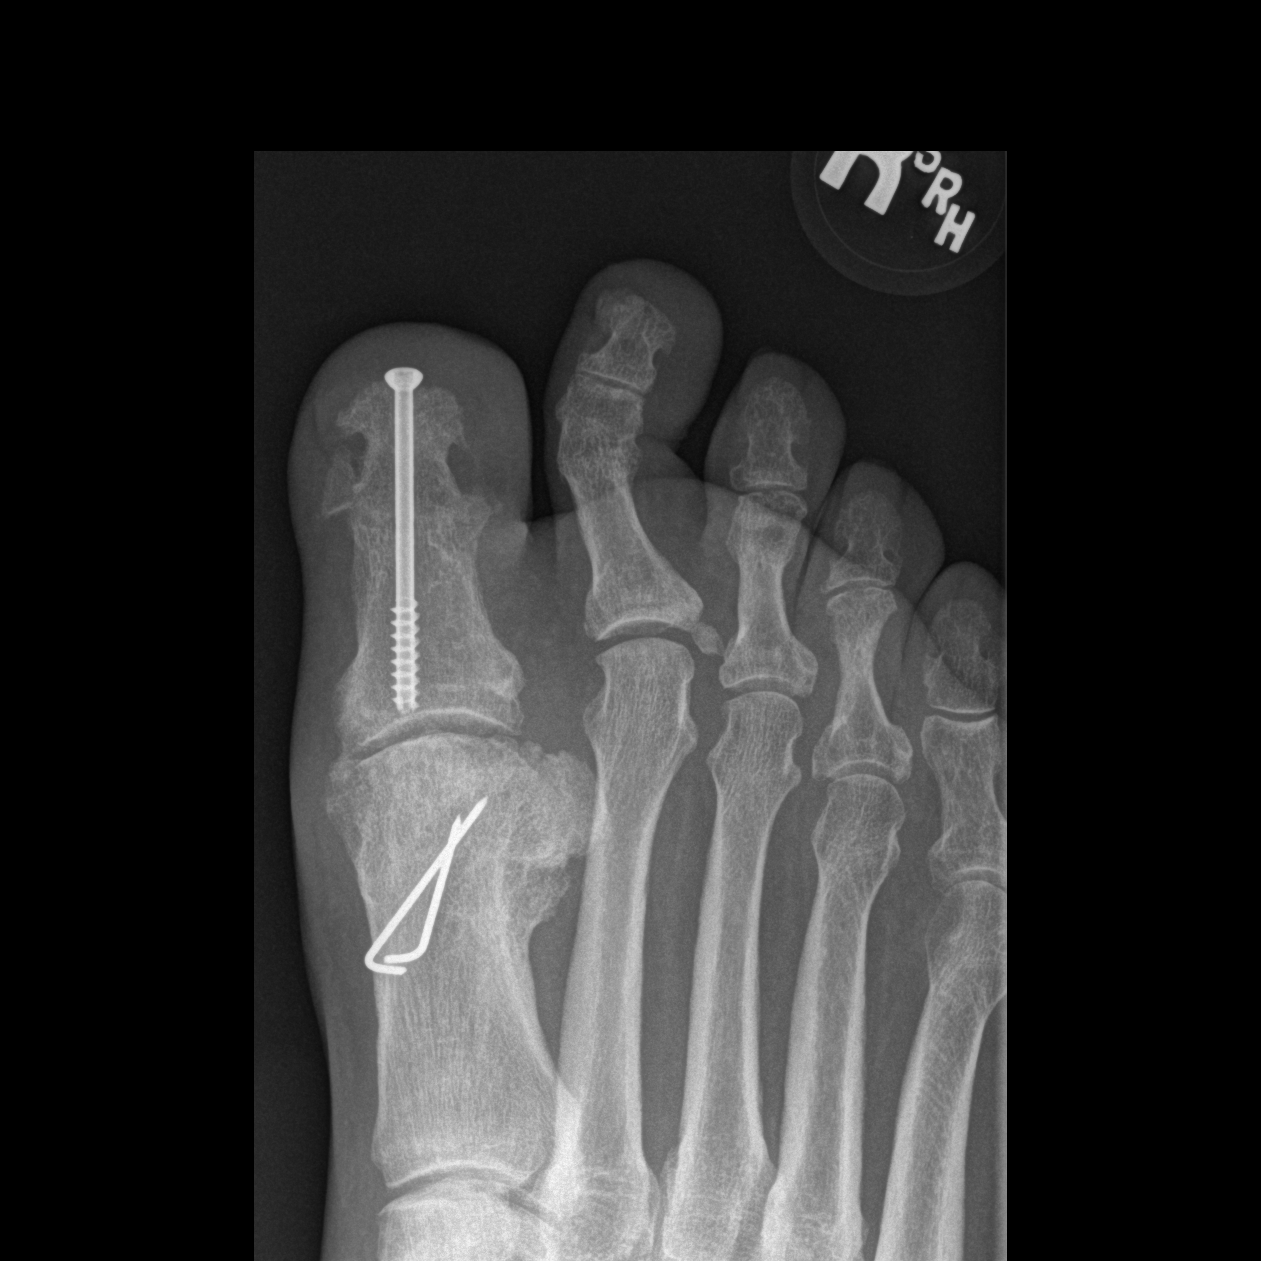

[t toes lateral right]
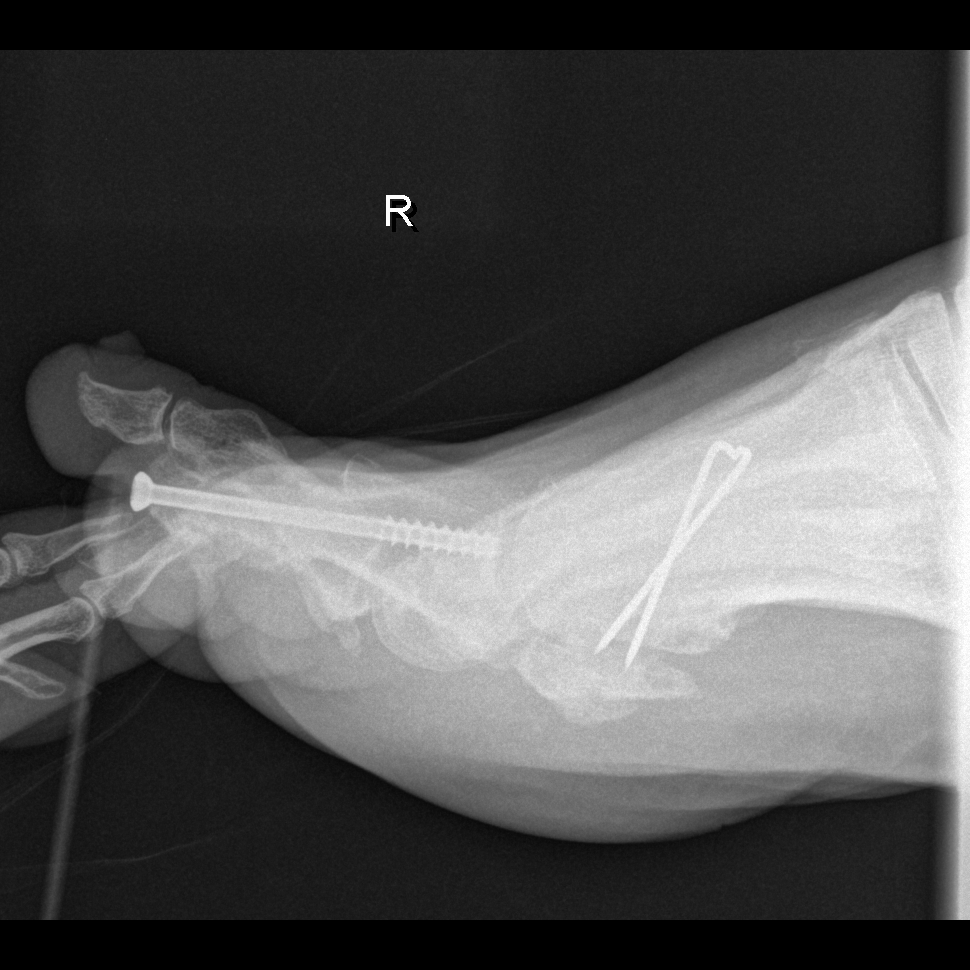

[3 of 3 positions shown; findings below may reference images not displayed]

FINDINGS: Postsurgical changes are noted in the first digit. No acute fracture
or dislocation is seen in the second toe. No gross soft tissue
abnormality is noted.
IMPRESSION: No acute abnormality seen.

## 2024-01-14 DEATH — deceased
